# Patient Record
Sex: Male | Born: 1985 | Race: White | Hispanic: No | Marital: Single | Smoking: Never smoker
Health system: Southern US, Community
[De-identification: ages and names within clinical notes are randomized; demographics above are authoritative.]

## PROBLEM LIST (undated history)

## (undated) HISTORY — PX: NECK SURGERY: SHX720

---

## 2004-11-08 ENCOUNTER — Emergency Department: Payer: Self-pay | Admitting: Emergency Medicine

## 2005-10-06 ENCOUNTER — Emergency Department: Payer: Self-pay | Admitting: Internal Medicine

## 2007-04-01 ENCOUNTER — Emergency Department: Payer: Self-pay | Admitting: Emergency Medicine

## 2007-04-08 ENCOUNTER — Emergency Department: Payer: Self-pay | Admitting: Emergency Medicine

## 2009-05-08 ENCOUNTER — Emergency Department: Payer: Self-pay | Admitting: Emergency Medicine

## 2012-08-02 ENCOUNTER — Emergency Department: Payer: Self-pay | Admitting: Emergency Medicine

## 2012-08-02 LAB — MONONUCLEOSIS SCREEN: Mono Test: POSITIVE

## 2015-08-07 ENCOUNTER — Encounter: Payer: Self-pay | Admitting: Emergency Medicine

## 2015-08-07 ENCOUNTER — Emergency Department: Payer: Self-pay

## 2015-08-07 ENCOUNTER — Emergency Department
Admission: EM | Admit: 2015-08-07 | Discharge: 2015-08-07 | Disposition: A | Discharge status: Home / Self Care | Payer: Self-pay | Attending: Emergency Medicine | Admitting: Emergency Medicine

## 2015-08-07 DIAGNOSIS — K5900 Constipation, unspecified: Secondary | ICD-10-CM | POA: Insufficient documentation

## 2015-08-07 DIAGNOSIS — R1030 Lower abdominal pain, unspecified: Secondary | ICD-10-CM

## 2015-08-07 LAB — COMPREHENSIVE METABOLIC PANEL
ALBUMIN: 4.6 g/dL (ref 3.5–5.0)
ALK PHOS: 82 U/L (ref 38–126)
ALT: 38 U/L (ref 17–63)
ANION GAP: 5 (ref 5–15)
AST: 25 U/L (ref 15–41)
BUN: 16 mg/dL (ref 6–20)
CHLORIDE: 109 mmol/L (ref 101–111)
CO2: 25 mmol/L (ref 22–32)
Calcium: 9.1 mg/dL (ref 8.9–10.3)
Creatinine, Ser: 0.86 mg/dL (ref 0.61–1.24)
GFR calc non Af Amer: >60 mL/min (ref >60)
GLUCOSE: 115 mg/dL — AB (ref 65–99)
Potassium: 4.4 mmol/L (ref 3.5–5.1)
Sodium: 139 mmol/L (ref 135–145)
Total Bilirubin: 0.5 mg/dL (ref 0.3–1.2)
Total Protein: 7.1 g/dL (ref 6.5–8.1)

## 2015-08-07 LAB — URINALYSIS COMPLETE WITH MICROSCOPIC (ARMC ONLY)
Bacteria, UA: NONE SEEN
Bilirubin Urine: NEGATIVE
GLUCOSE, UA: NEGATIVE mg/dL
Hgb urine dipstick: NEGATIVE
KETONES UR: NEGATIVE mg/dL
Leukocytes, UA: NEGATIVE
NITRITE: NEGATIVE
Protein, ur: NEGATIVE mg/dL
RBC / HPF: NONE SEEN RBC/hpf (ref 0–5)
SPECIFIC GRAVITY, URINE: 1.021 (ref 1.005–1.030)
Squamous Epithelial / LPF: NONE SEEN
WBC, UA: NONE SEEN WBC/hpf (ref 0–5)
pH: 7 (ref 5.0–8.0)

## 2015-08-07 LAB — CBC
HCT: 42.3 % (ref 40.0–52.0)
HEMOGLOBIN: 14.6 g/dL (ref 13.0–18.0)
MCH: 30.6 pg (ref 26.0–34.0)
MCHC: 34.6 g/dL (ref 32.0–36.0)
MCV: 88.6 fL (ref 80.0–100.0)
Platelets: 165 10*3/uL (ref 150–440)
RBC: 4.78 MIL/uL (ref 4.40–5.90)
RDW: 13.1 % (ref 11.5–14.5)
WBC: 7.3 10*3/uL (ref 3.8–10.6)

## 2015-08-07 LAB — LIPASE, BLOOD: LIPASE: 35 U/L (ref 11–51)

## 2015-08-07 MED ORDER — DICYCLOMINE HCL 20 MG PO TABS: 20.0000 mg | ORAL_TABLET | Freq: Three times a day (TID) | ORAL | Status: AC | PRN

## 2015-08-07 NOTE — ED Notes (Signed)
Pt reports left sided abdominal pain for a few months, reports hurts worse with movement. Pt reports occassional constipation and feels like this is related. Pt denies nausea or vomiting.

## 2015-08-07 NOTE — ED Provider Notes (Signed)
[name]     Time seen: ----------------------------------------- 3:01 PM on 08/07/2015 -----------------------------------------    I have reviewed the triage vital signs and the nursing notes.   HISTORY  Chief Complaint Abdominal Pain    HPI Peter Brandt. is a 29 y.o. male who presents ER for left-sided abdominal pain for the past several months. Patient states it hurts worse with movements. Patient reports feeling occasional constipation, denies fevers, chills, chest pain, shortness of breath, nausea vomiting or diarrhea.   History reviewed. No pertinent past medical history.  There are no active problems to display for this patient.   Past Surgical History  Procedure Laterality Date  . Neck surgery      Allergies Review of patient's allergies indicates no known allergies.  Social History Social History  Substance Use Topics  . Smoking status: Never Smoker   . Smokeless tobacco: None  . Alcohol Use: No    Review of Systems Constitutional: Negative for fever. Eyes: Negative for visual changes. ENT: Negative for sore throat. Cardiovascular: Negative for chest pain. Respiratory: Negative for shortness of breath. Gastrointestinal: Positive for abdominal pain and constipation Genitourinary: Negative for dysuria. Musculoskeletal: Negative for back pain. Skin: Negative for rash. Neurological: Negative for headaches, focal weakness or numbness.  10-point ROS otherwise negative.  ____________________________________________   PHYSICAL EXAM:  VITAL SIGNS: ED Triage Vitals  Enc Vitals Group     BP 08/07/15 1253 134/8 mmHg     Pulse Rate 08/07/15 1253 85     Resp 08/07/15 1253 20     Temp 08/07/15 1253 98.7 F (37.1 C)     Temp Source 08/07/15 1253 Oral     SpO2 08/07/15 1253 98 %     Weight 08/07/15 1253 205 lb (92.987 kg)     Height 08/07/15 1253  (1.753 m)     Head Cir --       Peak Flow --      Pain Score 08/07/15 1256 7     Pain Loc --      Pain Edu? --      Excl. in GC? --     Constitutional: Alert and oriented. Well appearing and in no distress. Eyes: Conjunctivae are normal. PERRL. Normal extraocular movements. ENT   Head: Normocephalic and atraumatic.   Nose: No congestion/rhinnorhea.   Mouth/Throat: Mucous membranes are moist.   Neck: No stridor. Cardiovascular: Normal rate, regular rhythm. Normal and symmetric distal pulses are present in all extremities. No murmurs, rubs, or gallops. Respiratory: Normal respiratory effort without tachypnea nor retractions. Breath sounds are clear and equal bilaterally. No wheezes/rales/rhonchi. Gastrointestinal: Soft and nontender. No distention. No abdominal bruits.  Musculoskeletal: Nontender with normal range of motion in all extremities. No joint effusions.  No lower extremity tenderness nor edema. Neurologic:  Normal speech and language. No gross focal neurologic deficits are appreciated. Speech is normal. No gait instability. Skin:  Skin is warm, dry and intact. No rash noted. Psychiatric: Mood and affect are normal. Speech and behavior are normal. Patient exhibits appropriate insight and judgment. ____________________________________________  ED COURSE:  Pertinent labs & imaging results that were available during my care of the patient were reviewed by me and considered in my medical decision making (see chart for details). Patient is in no acute distress, will obtain basic labs and reevaluate. ____________________________________________    LABS (pertinent positives/[facility] - Abnormal; Notable for the following:  Glucose, Bld 115 (*)    All other components within normal limits  URINALYSIS COMPLETEWITH MICROSCOPIC ([facility] ONLY) - Abnormal; Notable for the following:    Color, Urine YELLOW (*)    APPearance TURBID (*)    All other  components within normal limits  LIPASE, BLOOD  CBC    RADIOLOGY Images were viewed by me  CT renal protocol IMPRESSION: No acute findings in the abdomen/pelvis. ____________________________________________  FINAL ASSESSMENT AND PLAN  Abdominal pain  Plan: Patient with labs and imaging as dictated above. Nonspecific abdominal pain. Labs and CT are unremarkable. No hepatosplenomegaly. Patient is stable for outpatient follow-up with his doctor   Emily FilbertWilliams, Lear Carstens E, MD   Emily FilbertJonathan E Latravis Grine, MD 08/07/15 [phone]

## 2015-08-07 NOTE — Discharge Instructions (Signed)
Abdominal Pain, Adult °Many things can cause abdominal pain. Usually, abdominal pain is not caused by a disease and will improve without treatment. It can often be observed and treated at home. Your health care provider will do a physical exam and possibly order blood tests and X-rays to help determine the seriousness of your pain. However, in many cases, more time must pass before a clear cause of the pain can be found. Before that point, your health care provider may not know if you need more testing or further treatment. °HOME CARE INSTRUCTIONS °Monitor your abdominal pain for any changes. The following actions may help to alleviate any discomfort you are experiencing: °· Only take over-the-counter or prescription medicines as directed by your health care provider. °· Do not take laxatives unless directed to do so by your health care provider. °· Try a clear liquid diet (broth, tea, or water) as directed by your health care provider. Slowly move to a bland diet as tolerated. °SEEK MEDICAL CARE IF: °· You have unexplained abdominal pain. °· You have abdominal pain associated with nausea or diarrhea. °· You have pain when you urinate or have a bowel movement. °· You experience abdominal pain that wakes you in the night. °· You have abdominal pain that is worsened or improved by eating food. °· You have abdominal pain that is worsened with eating fatty foods. °· You have a fever. °SEEK IMMEDIATE MEDICAL CARE IF: °· Your pain does not go away within 2 hours. °· You keep throwing up (vomiting). °· Your pain is felt only in portions of the abdomen, such as the right side or the left lower portion of the abdomen. °· You pass bloody or black tarry stools. °MAKE SURE YOU: °· Understand these instructions. °· Will watch your condition. °· Will get help right away if you are not doing well or get worse. °  °This information is not intended to replace advice given to you by your health care provider. Make sure you discuss  any questions you have with your health care provider. °  °Document Released: 06/08/2005 Document Revised: 05/20/2015 Document Reviewed: 05/08/2013 °Elsevier Interactive Patient Education ©2016 [employer] °Irritable Bowel Syndrome, Adult °Irritable bowel syndrome (IBS) is not one specific disease. It is a group of symptoms that affects the organs responsible for digestion (gastrointestinal or GI tract).  °To regulate how your GI tract works, your body sends signals back and forth between your intestines and your brain. If you have IBS, there may be a problem with these signals. As a result, your GI tract does not function normally. Your intestines may become more sensitive and overreact to certain things. This is especially true when you eat certain foods or when you are under stress.  °There are four types of IBS. These may be determined based on the consistency of your stool:  °· IBS with diarrhea.   °· IBS with constipation.   °· Mixed IBS.   °· Unsubtyped IBS.   °It is important to know which type of IBS you have. Some treatments are more likely to be helpful for certain types of IBS.  °CAUSES  °The exact cause of IBS is not known. °RISK FACTORS °You may have a higher risk of IBS if: °· You are a woman. °· You are younger than 31 years old. °· You have a family history of IBS. °· You have mental health problems. °· You have had bacterial infection of your GI tract. °SIGNS AND SYMPTOMS  °Symptoms of IBS vary from person   to person. The main symptom is abdominal pain or discomfort. Additional symptoms usually include one or more of the following:  °· Diarrhea, constipation, or both.   °· Abdominal swelling or bloating.   °· Feeling full or sick after eating a small or regular-size meal.   °· Frequent gas.   °· Mucus in the stool.   °· A feeling of having more stool left after a bowel movement.   °Symptoms tend to come and go. They may be associated with stress, psychiatric conditions, or nothing at all.    °DIAGNOSIS  °There is no specific test to diagnose IBS. Your health care provider will make a diagnosis based on a physical exam, medical history, and your symptoms. You may have other tests to rule out other conditions that may be causing your symptoms. These may include:  °· Blood tests.   °· X-rays.   °· CT scan. °· Endoscopy and colonoscopy. This is a test in which your GI tract is viewed with a long, thin, flexible tube. °TREATMENT °There is no cure for IBS, but treatment can help relieve symptoms. IBS treatment often includes:  °· Changes to your diet, such as: °¨ Eating more fiber. °¨ Avoiding foods that cause symptoms. °¨ Drinking more water. °¨ Eating regular, medium-sized portioned meals. °· Medicines. These may include: °¨ Fiber supplements if you have constipation. °¨ Medicine to control diarrhea (antidiarrheal medicines). °¨ Medicine to help control muscle spasms in your GI tract (antispasmodic medicines). °¨ Medicines to help with any mental health issues, such as antidepressants or tranquilizers. °· Therapy. °¨ Talk therapy may help with anxiety, depression, or other mental health issues that can make IBS symptoms worse. °· Stress reduction. °¨ Managing your stress can help keep symptoms under control. °HOME CARE INSTRUCTIONS  °· Take medicines only as directed by your health care provider. °· Eat a healthy diet. °¨ Avoid foods and drinks with added sugar. °¨ Include more whole grains, fruits, and vegetables gradually into your diet. This may be especially helpful if you have IBS with constipation. °¨ Avoid any foods and drinks that make your symptoms worse. These may include dairy products and caffeinated or carbonated drinks. °¨ Do not eat large meals. °¨ Drink enough fluid to keep your urine clear or pale yellow. °· Exercise regularly. Ask your health care provider for recommendations of good activities for you. °· Keep all follow-up visits as directed by your health care provider. This is  important. °SEEK MEDICAL CARE IF:  °· You have constant pain. °· You have trouble or pain with swallowing. °· You have worsening diarrhea. °SEEK IMMEDIATE MEDICAL CARE IF:  °· You have severe and worsening abdominal pain.   °· You have diarrhea and:   °¨ You have a rash, stiff neck, or severe headache.   °¨ You are irritable, sleepy, or difficult to awaken.   °¨ You are weak, dizzy, or extremely thirsty.   °· You have bright red blood in your stool or you have black tarry stools.   °· You have unusual abdominal swelling that is painful.   °· You vomit continuously.   °· You vomit blood (hematemesis).   °· You have both abdominal pain and a fever.     °  °This information is not intended to replace advice given to you by your health care provider. Make sure you discuss any questions you have with your health care provider. °  °Document Released: 08/29/2005 Document Revised: 09/19/2014 Document Reviewed: 05/16/2014 °Elsevier Interactive Patient Education ©2016 [employer] Inc[employer] ° °

## 2016-06-09 ENCOUNTER — Encounter: Payer: Self-pay | Admitting: Emergency Medicine

## 2016-06-09 ENCOUNTER — Emergency Department
Admission: EM | Admit: 2016-06-09 | Discharge: 2016-06-09 | Disposition: A | Discharge status: Home / Self Care | Payer: BLUE CROSS/BLUE SHIELD | Attending: Emergency Medicine | Admitting: Emergency Medicine

## 2016-06-09 ENCOUNTER — Emergency Department: Payer: BLUE CROSS/BLUE SHIELD

## 2016-06-09 DIAGNOSIS — S161XXA Strain of muscle, fascia and tendon at neck level, initial encounter: Secondary | ICD-10-CM | POA: Diagnosis not present

## 2016-06-09 DIAGNOSIS — S29012A Strain of muscle and tendon of back wall of thorax, initial encounter: Secondary | ICD-10-CM | POA: Diagnosis not present

## 2016-06-09 DIAGNOSIS — Y999 Unspecified external cause status: Secondary | ICD-10-CM | POA: Insufficient documentation

## 2016-06-09 DIAGNOSIS — S39012A Strain of muscle, fascia and tendon of lower back, initial encounter: Secondary | ICD-10-CM | POA: Diagnosis not present

## 2016-06-09 DIAGNOSIS — Y9389 Activity, other specified: Secondary | ICD-10-CM | POA: Insufficient documentation

## 2016-06-09 DIAGNOSIS — S29019A Strain of muscle and tendon of unspecified wall of thorax, initial encounter: Secondary | ICD-10-CM

## 2016-06-09 DIAGNOSIS — Y92410 Unspecified street and highway as the place of occurrence of the external cause: Secondary | ICD-10-CM | POA: Insufficient documentation

## 2016-06-09 DIAGNOSIS — S199XXA Unspecified injury of neck, initial encounter: Secondary | ICD-10-CM | POA: Diagnosis present

## 2016-06-09 MED ORDER — HYDROCODONE-ACETAMINOPHEN 5-325 MG PO TABS: 1.0000 | ORAL_TABLET | ORAL | 0 refills | Status: DC | PRN

## 2016-06-09 MED ORDER — HYDROCODONE-ACETAMINOPHEN 5-325 MG PO TABS
1.0000 | ORAL_TABLET | Freq: Once | ORAL | Status: AC
  Administered 2016-06-09: 1 via ORAL
  Filled 2016-06-09: qty 1

## 2016-06-09 MED ORDER — IBUPROFEN 600 MG PO TABS
600.0000 mg | ORAL_TABLET | Freq: Once | ORAL | Status: AC
  Administered 2016-06-09: 600 mg via ORAL
  Filled 2016-06-09: qty 1

## 2016-06-09 MED ORDER — IBUPROFEN 600 MG PO TABS: 600.0000 mg | ORAL_TABLET | Freq: Three times a day (TID) | ORAL | 0 refills | Status: AC | PRN
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
## 2016-06-09 NOTE — Discharge Instructions (Signed)
[facility] if any [facility]. Ice or heat to muscles and is needed for comfort. Take medication as directed. Do not drive while taking Norco as this could cause drowsiness. You can anticipate being sore for approximately 4-5 days.
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
## 2017-07-07 ENCOUNTER — Emergency Department: Payer: BLUE CROSS/BLUE SHIELD | Admitting: Anesthesiology

## 2017-07-07 ENCOUNTER — Encounter: Payer: Self-pay | Admitting: Emergency Medicine

## 2017-07-07 ENCOUNTER — Emergency Department
Admission: EM | Admit: 2017-07-07 | Discharge: 2017-07-07 | Disposition: A | Discharge status: Home / Self Care | Payer: BLUE CROSS/BLUE SHIELD | Attending: Emergency Medicine | Admitting: Emergency Medicine

## 2017-07-07 ENCOUNTER — Encounter
Admission: EM | Disposition: A | Discharge status: Home / Self Care | Payer: Self-pay | Source: Home / Self Care | Attending: Emergency Medicine

## 2017-07-07 ENCOUNTER — Emergency Department: Payer: BLUE CROSS/BLUE SHIELD

## 2017-07-07 DIAGNOSIS — W3400XA Accidental discharge from unspecified firearms or gun, initial encounter: Secondary | ICD-10-CM | POA: Diagnosis not present

## 2017-07-07 DIAGNOSIS — S91302A Unspecified open wound, left foot, initial encounter: Secondary | ICD-10-CM | POA: Insufficient documentation

## 2017-07-07 DIAGNOSIS — Z9889 Other specified postprocedural states: Secondary | ICD-10-CM | POA: Insufficient documentation

## 2017-07-07 DIAGNOSIS — T148XXA Other injury of unspecified body region, initial encounter: Secondary | ICD-10-CM

## 2017-07-07 DIAGNOSIS — S92242A Displaced fracture of medial cuneiform of left foot, initial encounter for closed fracture: Secondary | ICD-10-CM | POA: Insufficient documentation

## 2017-07-07 HISTORY — PX: IRRIGATION AND DEBRIDEMENT FOOT: SHX6602

## 2017-07-07 LAB — COMPREHENSIVE METABOLIC PANEL
ALBUMIN: 4.7 g/dL (ref 3.5–5.0)
ALK PHOS: 71 U/L (ref 38–126)
ALT: 35 U/L (ref 17–63)
ANION GAP: 10 (ref 5–15)
AST: 26 U/L (ref 15–41)
BUN: 17 mg/dL (ref 6–20)
CALCIUM: 9.1 mg/dL (ref 8.9–10.3)
CHLORIDE: 109 mmol/L (ref 101–111)
CO2: 21 mmol/L — AB (ref 22–32)
Creatinine, Ser: 0.98 mg/dL (ref 0.61–1.24)
GFR calc non Af Amer: >60 mL/min (ref >60)
GLUCOSE: 110 mg/dL — AB (ref 65–99)
POTASSIUM: 4 mmol/L (ref 3.5–5.1)
SODIUM: 140 mmol/L (ref 135–145)
Total Bilirubin: 0.7 mg/dL (ref 0.3–1.2)
Total Protein: 7.7 g/dL (ref 6.5–8.1)

## 2017-07-07 LAB — CBC WITH DIFFERENTIAL/PLATELET
BASOS PCT: 1 %
Basophils Absolute: 0.1 10*3/uL (ref 0–0.1)
Eosinophils Absolute: 0.4 10*3/uL (ref 0–0.7)
Eosinophils Relative: 5 %
HEMATOCRIT: 45.8 % (ref 40.0–52.0)
Hemoglobin: 15.6 g/dL (ref 13.0–18.0)
LYMPHS PCT: 41 %
Lymphs Abs: 3.2 10*3/uL (ref 1.0–3.6)
MCH: 30.6 pg (ref 26.0–34.0)
MCHC: 34.1 g/dL (ref 32.0–36.0)
MCV: 89.8 fL (ref 80.0–100.0)
MONO ABS: 0.7 10*3/uL (ref 0.2–1.0)
MONOS PCT: 9 %
NEUTROS ABS: 3.5 10*3/uL (ref 1.4–6.5)
NEUTROS PCT: 44 %
PLATELETS: 189 10*3/uL (ref 150–440)
RBC: 5.1 MIL/uL (ref 4.40–5.90)
RDW: 12.9 % (ref 11.5–14.5)
WBC: 7.9 10*3/uL (ref 3.8–10.6)

## 2017-07-07 SURGERY — IRRIGATION AND DEBRIDEMENT FOOT
Anesthesia: General | Site: Foot | Laterality: Left | Wound class: Clean

## 2017-07-07 MED ORDER — ONDANSETRON HCL 4 MG/2ML IJ SOLN: 4.0000 mg | Freq: Four times a day (QID) | INTRAMUSCULAR | Status: DC | PRN

## 2017-07-07 MED ORDER — FENTANYL CITRATE (PF) 100 MCG/2ML IJ SOLN: 25.0000 ug | INTRAMUSCULAR | Status: DC | PRN

## 2017-07-07 MED ORDER — SULFAMETHOXAZOLE-TRIMETHOPRIM 800-160 MG PO TABS: 1.0000 | ORAL_TABLET | Freq: Two times a day (BID) | ORAL | 0 refills | Status: DC

## 2017-07-07 MED ORDER — CEFAZOLIN SODIUM-DEXTROSE 2-3 GM-%(50ML) IV SOLR
INTRAVENOUS | Status: DC | PRN
  Administered 2017-07-07: 2 g via INTRAVENOUS

## 2017-07-07 MED ORDER — PROMETHAZINE HCL 25 MG/ML IJ SOLN
INTRAMUSCULAR | Status: AC
  Administered 2017-07-07: 12.5 mg via INTRAVENOUS
  Filled 2017-07-07: qty 1

## 2017-07-07 MED ORDER — PROMETHAZINE HCL 25 MG/ML IJ SOLN
12.5000 mg | Freq: Once | INTRAMUSCULAR | Status: AC
Start: 2017-07-07 — End: 2017-07-07
  Administered 2017-07-07: 12.5 mg via INTRAVENOUS

## 2017-07-07 MED ORDER — DEXAMETHASONE SODIUM PHOSPHATE 10 MG/ML IJ SOLN
INTRAMUSCULAR | Status: AC
  Filled 2017-07-07: qty 1

## 2017-07-07 MED ORDER — DEXAMETHASONE SODIUM PHOSPHATE 10 MG/ML IJ SOLN
INTRAMUSCULAR | Status: DC | PRN
  Administered 2017-07-07: 10 mg via INTRAVENOUS

## 2017-07-07 MED ORDER — OXYCODONE HCL 5 MG/5ML PO SOLN: 5.0000 mg | Freq: Once | ORAL | Status: DC | PRN

## 2017-07-07 MED ORDER — SODIUM CHLORIDE 0.9 % IV SOLN
3.0000 g | Freq: Once | INTRAVENOUS | Status: AC
  Administered 2017-07-07: 3 g via INTRAVENOUS
  Filled 2017-07-07: qty 3

## 2017-07-07 MED ORDER — LIDOCAINE HCL (CARDIAC) 20 MG/ML IV SOLN
INTRAVENOUS | Status: DC | PRN
  Administered 2017-07-07: 100 mg via INTRAVENOUS

## 2017-07-07 MED ORDER — SODIUM CHLORIDE 0.9 % IV BOLUS (SEPSIS)
1000.0000 mL | Freq: Once | INTRAVENOUS | Status: AC
  Administered 2017-07-07: 1000 mL via INTRAVENOUS

## 2017-07-07 MED ORDER — ONDANSETRON HCL 4 MG/2ML IJ SOLN
INTRAMUSCULAR | Status: DC | PRN
  Administered 2017-07-07: 4 mg via INTRAVENOUS

## 2017-07-07 MED ORDER — PROPOFOL 10 MG/ML IV BOLUS
INTRAVENOUS | Status: DC | PRN
  Administered 2017-07-07: 200 mg via INTRAVENOUS

## 2017-07-07 MED ORDER — ONDANSETRON HCL 4 MG PO TABS: 4.0000 mg | ORAL_TABLET | Freq: Four times a day (QID) | ORAL | Status: DC | PRN

## 2017-07-07 MED ORDER — IBUPROFEN 600 MG PO TABS: 600.0000 mg | ORAL_TABLET | Freq: Three times a day (TID) | ORAL | 0 refills | Status: DC | PRN

## 2017-07-07 MED ORDER — BUPIVACAINE HCL 0.5 % IJ SOLN
INTRAMUSCULAR | Status: DC | PRN
  Administered 2017-07-07: 13 mL

## 2017-07-07 MED ORDER — MIDAZOLAM HCL 2 MG/2ML IJ SOLN
INTRAMUSCULAR | Status: AC
  Filled 2017-07-07: qty 2

## 2017-07-07 MED ORDER — LIDOCAINE HCL (PF) 2 % IJ SOLN
INTRAMUSCULAR | Status: AC
  Filled 2017-07-07: qty 10

## 2017-07-07 MED ORDER — CEFAZOLIN SODIUM 1 G IJ SOLR
INTRAMUSCULAR | Status: AC
  Filled 2017-07-07: qty 20

## 2017-07-07 MED ORDER — GLYCOPYRROLATE 0.2 MG/ML IJ SOLN
INTRAMUSCULAR | Status: DC | PRN
Start: 2017-07-07 — End: 2017-07-07
  Administered 2017-07-07: 0.2 mg via INTRAVENOUS

## 2017-07-07 MED ORDER — GLYCOPYRROLATE 0.2 MG/ML IJ SOLN
INTRAMUSCULAR | Status: AC
  Filled 2017-07-07: qty 1

## 2017-07-07 MED ORDER — OXYCODONE-ACETAMINOPHEN 5-325 MG PO TABS: 1.0000 | ORAL_TABLET | Freq: Four times a day (QID) | ORAL | 0 refills | Status: DC | PRN

## 2017-07-07 MED ORDER — TETANUS-DIPHTH-ACELL PERTUSSIS 5-2.5-18.5 LF-MCG/0.5 IM SUSP
0.5000 mL | Freq: Once | INTRAMUSCULAR | Status: AC
  Administered 2017-07-07: 0.5 mL via INTRAMUSCULAR
  Filled 2017-07-07: qty 0.5

## 2017-07-07 MED ORDER — AMOXICILLIN-POT CLAVULANATE 875-125 MG PO TABS: 1.0000 | ORAL_TABLET | Freq: Two times a day (BID) | ORAL | 0 refills | Status: DC

## 2017-07-07 MED ORDER — OXYCODONE HCL 5 MG PO TABS: 5.0000 mg | ORAL_TABLET | Freq: Once | ORAL | Status: DC | PRN

## 2017-07-07 MED ORDER — MIDAZOLAM HCL 2 MG/2ML IJ SOLN
INTRAMUSCULAR | Status: DC | PRN
  Administered 2017-07-07: 2 mg via INTRAVENOUS

## 2017-07-07 MED ORDER — FENTANYL CITRATE (PF) 100 MCG/2ML IJ SOLN
INTRAMUSCULAR | Status: AC
  Filled 2017-07-07: qty 2

## 2017-07-07 MED ORDER — FENTANYL CITRATE (PF) 100 MCG/2ML IJ SOLN
INTRAMUSCULAR | Status: DC | PRN
  Administered 2017-07-07 (×2): 50 ug via INTRAVENOUS

## 2017-07-07 MED ORDER — PROPOFOL 10 MG/ML IV BOLUS
INTRAVENOUS | Status: AC
  Filled 2017-07-07: qty 20

## 2017-07-07 MED ORDER — LIDOCAINE-EPINEPHRINE 1 %-1:100000 IJ SOLN
INTRAMUSCULAR | Status: DC | PRN
  Administered 2017-07-07: 5 mL

## 2017-07-07 MED ORDER — PHENYLEPHRINE HCL 10 MG/ML IJ SOLN
INTRAMUSCULAR | Status: DC | PRN
  Administered 2017-07-07 (×2): 100 ug via INTRAVENOUS

## 2017-07-07 MED ORDER — OXYCODONE-ACETAMINOPHEN 5-325 MG PO TABS: 1.0000 | ORAL_TABLET | ORAL | 0 refills | Status: AC | PRN

## 2017-07-07 MED ORDER — LACTATED RINGERS IV SOLN
INTRAVENOUS | Status: DC | PRN
  Administered 2017-07-07: 13:00:00 via INTRAVENOUS

## 2017-07-07 MED ORDER — OXYCODONE-ACETAMINOPHEN 5-325 MG PO TABS: 1.0000 | ORAL_TABLET | ORAL | Status: DC | PRN

## 2017-07-07 MED ORDER — SODIUM CHLORIDE FLUSH 0.9 % IV SOLN
INTRAVENOUS | Status: AC
  Filled 2017-07-07: qty 10

## 2017-07-07 MED ORDER — ONDANSETRON HCL 4 MG/2ML IJ SOLN
INTRAMUSCULAR | Status: AC
  Filled 2017-07-07: qty 2

## 2017-07-07 MED ORDER — FENTANYL CITRATE (PF) 100 MCG/2ML IJ SOLN
100.0000 ug | Freq: Once | INTRAMUSCULAR | Status: AC
  Administered 2017-07-07: 100 ug via INTRAVENOUS
  Filled 2017-07-07: qty 2

## 2017-07-07 SURGICAL SUPPLY — 58 items
BANDAGE ACE 4X5 VEL STRL LF (GAUZE/BANDAGES/DRESSINGS) ×2 IMPLANT
BANDAGE CONFORM 2X5YD N/S (GAUZE/BANDAGES/DRESSINGS) ×2 IMPLANT
BANDAGE STRETCH 3X4.1 STRL (GAUZE/BANDAGES/DRESSINGS) ×4 IMPLANT
BLADE OSC/SAGITTAL MD 5.5X18 (BLADE) IMPLANT
BLADE OSCILLATING/SAGITTAL (BLADE)
BLADE SW THK.38XMED LNG THN (BLADE) IMPLANT
BNDG COHESIVE 4X5 TAN STRL (GAUZE/BANDAGES/DRESSINGS) ×2 IMPLANT
BNDG COHESIVE 6X5 TAN STRL LF (GAUZE/BANDAGES/DRESSINGS) IMPLANT
BNDG ESMARK 4X12 TAN STRL LF (GAUZE/BANDAGES/DRESSINGS) ×2 IMPLANT
BNDG GAUZE 4.5X4.1 6PLY STRL (MISCELLANEOUS) ×2 IMPLANT
CANISTER SUCT 1200ML W/VALVE (MISCELLANEOUS) ×2 IMPLANT
CANISTER SUCT 3000ML PPV (MISCELLANEOUS) ×2 IMPLANT
CUFF TOURN 18 STER (MISCELLANEOUS) IMPLANT
CUFF TOURN DUAL PL 12 NO SLV (MISCELLANEOUS) ×2 IMPLANT
DRAPE FLUOR MINI C-ARM 54X84 (DRAPES) IMPLANT
DRAPE XRAY CASSETTE 23X24 (DRAPES) IMPLANT
DRESSING ALLEVYN 4X4 (MISCELLANEOUS) IMPLANT
DURAPREP 26ML APPLICATOR (WOUND CARE) ×2 IMPLANT
ELECT REM PT RETURN 9FT ADLT (ELECTROSURGICAL) ×2
ELECTRODE REM PT RTRN 9FT ADLT (ELECTROSURGICAL) ×1 IMPLANT
GAUZE PACKING 1/4 X5 YD (GAUZE/BANDAGES/DRESSINGS) ×2 IMPLANT
GAUZE PACKING IODOFORM 1X5 (MISCELLANEOUS) ×2 IMPLANT
GAUZE PETRO XEROFOAM 1X8 (MISCELLANEOUS) ×2 IMPLANT
GAUZE SPONGE 4X4 12PLY STRL (GAUZE/BANDAGES/DRESSINGS) ×4 IMPLANT
GAUZE STRETCH 2X75IN STRL (MISCELLANEOUS) ×2 IMPLANT
GAUZE XEROFORM 4X4 STRL (GAUZE/BANDAGES/DRESSINGS) ×2 IMPLANT
GLOVE BIO SURGEON STRL SZ7.5 (GLOVE) ×2 IMPLANT
GLOVE INDICATOR 8.0 STRL GRN (GLOVE) ×2 IMPLANT
GOWN STRL REUS W/ TWL LRG LVL3 (GOWN DISPOSABLE) ×2 IMPLANT
GOWN STRL REUS W/TWL LRG LVL3 (GOWN DISPOSABLE) ×2
GOWN STRL REUS W/TWL MED LVL3 (GOWN DISPOSABLE) ×4 IMPLANT
HANDPIECE VERSAJET DEBRIDEMENT (MISCELLANEOUS) IMPLANT
IV NS 1000ML (IV SOLUTION) ×1
IV NS 1000ML BAXH (IV SOLUTION) ×1 IMPLANT
KIT RM TURNOVER STRD PROC AR (KITS) ×2 IMPLANT
LABEL OR SOLS (LABEL) ×2 IMPLANT
NEEDLE FILTER BLUNT 18X 1/2SAF (NEEDLE) ×1
NEEDLE FILTER BLUNT 18X1 1/2 (NEEDLE) ×1 IMPLANT
NEEDLE HYPO 25X1 1.5 SAFETY (NEEDLE) ×2 IMPLANT
NS IRRIG 500ML POUR BTL (IV SOLUTION) ×2 IMPLANT
PACK EXTREMITY ARMC (MISCELLANEOUS) ×2 IMPLANT
PAD ABD DERMACEA PRESS 5X9 (GAUZE/BANDAGES/DRESSINGS) ×4 IMPLANT
RASP SM TEAR CROSS CUT (RASP) IMPLANT
SOL .9 NS 3000ML IRR  AL (IV SOLUTION) ×1
SOL .9 NS 3000ML IRR UROMATIC (IV SOLUTION) ×1 IMPLANT
SOL PREP PVP 2OZ (MISCELLANEOUS) ×2
SOLUTION PREP PVP 2OZ (MISCELLANEOUS) ×1 IMPLANT
STOCKINETTE IMPERVIOUS 9X36 MD (GAUZE/BANDAGES/DRESSINGS) ×2 IMPLANT
SUT ETHILON 2 0 FS 18 (SUTURE) ×4 IMPLANT
SUT ETHILON 4-0 (SUTURE) ×1
SUT ETHILON 4-0 FS2 18XMFL BLK (SUTURE) ×1
SUT VIC AB 3-0 SH 27 (SUTURE) ×1
SUT VIC AB 3-0 SH 27X BRD (SUTURE) ×1 IMPLANT
SUT VIC AB 4-0 FS2 27 (SUTURE) ×2 IMPLANT
SUTURE ETHLN 4-0 FS2 18XMF BLK (SUTURE) ×1 IMPLANT
SWAB CULTURE AMIES ANAERIB BLU (MISCELLANEOUS) IMPLANT
SYR 3ML LL SCALE MARK (SYRINGE) ×2 IMPLANT
SYRINGE 10CC LL (SYRINGE) ×4 IMPLANT

## 2017-07-07 NOTE — Anesthesia Procedure Notes (Signed)
Procedure Name: LMA Insertion Date/Time: 07/07/2017 12:51 PM Performed by: Marlana SalvageJESSUP, Reneka Nebergall Pre-anesthesia Checklist: Patient identified, Emergency Drugs available, Suction available, Patient being monitored and Timeout performed Patient Re-evaluated:Patient Re-evaluated prior to induction Oxygen Delivery Method: Circle system utilized Preoxygenation: Pre-oxygenation with 100% oxygen Induction Type: IV induction Ventilation: Mask ventilation without difficulty LMA: LMA inserted LMA Size: 5.0 Number of attempts: 1 Placement Confirmation: positive ETCO2 Tube secured with: Tape Dental Injury: Teeth and Oropharynx as per pre-operative assessment
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
## 2017-07-07 NOTE — Anesthesia Post-op Follow-up Note (Signed)
Anesthesia QCDR form completed.        
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
## 2017-07-07 NOTE — Transfer of Care (Signed)
Immediate Anesthesia Transfer of Care Note  Patient: Peter LungerJesse R Wingert Jr.  Procedure(s) Performed: IRRIGATION AND DEBRIDEMENT FOOT (Left Foot)  Patient Location: PACU  Anesthesia Type:General  Level of Consciousness: drowsy  Airway & Oxygen Therapy: Patient Spontanous Breathing and Patient connected to face mask oxygen  Post-op Assessment: Report given to RN and Post -op Vital signs reviewed and stable  Post vital signs: Reviewed and stable  Last Vitals:  Vitals:   07/07/17 1131 07/07/17 1325  BP: (!) 153/80 111/66  Pulse: 89 84  Resp:  12  Temp:  (!) 36.3 C  SpO2: 96% 98%    Last Pain:  Vitals:   07/07/17 1325  TempSrc: Other (Comment)  PainSc: 0-No pain         Complications: No apparent anesthesia complications

## 2017-07-07 NOTE — ED Notes (Signed)
[employer] at bedside 
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
## 2017-07-07 NOTE — Anesthesia Postprocedure Evaluation (Signed)
Anesthesia Post Note  Patient: Catalina LungerJesse R Iiams Jr.  Procedure(s) Performed: IRRIGATION AND DEBRIDEMENT FOOT (Left Foot)  Patient location during evaluation: PACU Anesthesia Type: General Level of consciousness: awake and alert Pain management: pain level controlled Vital Signs Assessment: post-procedure vital signs reviewed and stable Respiratory status: spontaneous breathing, nonlabored ventilation, respiratory function stable and patient connected to nasal cannula oxygen Cardiovascular status: blood pressure returned to baseline and stable Postop Assessment: no apparent nausea or vomiting Anesthetic complications: no     Last Vitals:  Vitals:   07/07/17 1410 07/07/17 1425  BP: 121/84 117/79  Pulse: 76 65  Resp: 13 15  Temp:  37.1 C  SpO2: 96% 95%    Last Pain:  Vitals:   07/07/17 1425  TempSrc:   PainSc: 0-No pain                 Cleda MccreedyJoseph K Piscitello

## 2017-07-07 NOTE — ED Notes (Signed)
ED Provider at bedside. 

## 2017-07-08 ENCOUNTER — Encounter: Payer: Self-pay | Admitting: Podiatry
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
